# Patient Record
Sex: Female | Born: 2000 | Race: White | Hispanic: No | Marital: Single | Smoking: Never smoker
Health system: Southern US, Community
[De-identification: ages and names within clinical notes are randomized; demographics above are authoritative.]

---
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
## 2020-04-30 ENCOUNTER — Other Ambulatory Visit: Payer: Self-pay

## 2020-04-30 ENCOUNTER — Emergency Department: Payer: Medicaid Other

## 2020-04-30 DIAGNOSIS — R42 Dizziness and giddiness: Secondary | ICD-10-CM | POA: Diagnosis not present

## 2020-04-30 DIAGNOSIS — F410 Panic disorder [episodic paroxysmal anxiety] without agoraphobia: Secondary | ICD-10-CM | POA: Diagnosis not present

## 2020-04-30 DIAGNOSIS — R0602 Shortness of breath: Secondary | ICD-10-CM | POA: Insufficient documentation

## 2020-04-30 DIAGNOSIS — Z79899 Other long term (current) drug therapy: Secondary | ICD-10-CM | POA: Diagnosis not present

## 2020-04-30 DIAGNOSIS — R202 Paresthesia of skin: Secondary | ICD-10-CM | POA: Diagnosis not present

## 2020-04-30 DIAGNOSIS — R0789 Other chest pain: Secondary | ICD-10-CM | POA: Diagnosis not present

## 2020-04-30 DIAGNOSIS — R079 Chest pain, unspecified: Secondary | ICD-10-CM | POA: Diagnosis present

## 2020-04-30 LAB — BASIC METABOLIC PANEL
Anion gap: 9 (ref 5–15)
BUN: 8 mg/dL (ref 6–20)
CO2: 22 mmol/L (ref 22–32)
Calcium: 9.5 mg/dL (ref 8.9–10.3)
Chloride: 106 mmol/L (ref 98–111)
Creatinine, Ser: 0.77 mg/dL (ref 0.44–1.00)
GFR, Estimated: >60 mL/min (ref >60)
Glucose, Bld: 122 mg/dL — ABNORMAL HIGH (ref 70–99)
Potassium: 3.7 mmol/L (ref 3.5–5.1)
Sodium: 137 mmol/L (ref 135–145)

## 2020-04-30 LAB — CBC
HCT: 38 % (ref 36.0–46.0)
Hemoglobin: 13 g/dL (ref 12.0–15.0)
MCH: 30 pg (ref 26.0–34.0)
MCHC: 34.2 g/dL (ref 30.0–36.0)
MCV: 87.6 fL (ref 80.0–100.0)
Platelets: 337 10*3/uL (ref 150–400)
RBC: 4.34 MIL/uL (ref 3.87–5.11)
RDW: 12 % (ref 11.5–15.5)
WBC: 8.5 10*3/uL (ref 4.0–10.5)
nRBC: 0 % (ref 0.0–0.2)

## 2020-04-30 LAB — TROPONIN I (HIGH SENSITIVITY): Troponin I (High Sensitivity): <2 ng/L (ref <18)

## 2020-04-30 NOTE — ED Triage Notes (Signed)
'  past few days I feel like there is a rock in my chest, shallow breathing, going up stairs/working out makes shob, inability to focus on letters with reading, L arm going to sleep, dizziness, bilateral ear pressure and tonsils inflammed. This has happened before and they say its my anxiety but this feels different.

## 2020-05-01 ENCOUNTER — Emergency Department
Admission: EM | Admit: 2020-05-01 | Discharge: 2020-05-01 | Disposition: A | Discharge status: Home / Self Care | Payer: Medicaid Other | Attending: Emergency Medicine | Admitting: Emergency Medicine

## 2020-05-01 DIAGNOSIS — F410 Panic disorder [episodic paroxysmal anxiety] without agoraphobia: Secondary | ICD-10-CM

## 2020-05-01 DIAGNOSIS — R0789 Other chest pain: Secondary | ICD-10-CM

## 2020-05-01 LAB — TROPONIN I (HIGH SENSITIVITY): Troponin I (High Sensitivity): <2 ng/L (ref <18)

## 2020-05-01 NOTE — ED Provider Notes (Signed)
[facility] Emergency Department Provider Note  ____________________________________________   Event Date/Time   First MD Initiated Contact with Patient 05/01/20 0157     (approximate)  I have reviewed the triage vital signs and the nursing notes.   HISTORY  Chief Complaint Chest Pain    HPI Kathleen Cannon is a 20 y.o. female with obesity who presents to the emergency department multiple complaints.  She reports for 2 days she has had left-sided chest pain that feels like "a weight on my chest".  She has had associated shortness of breath, head pressure, tingling in her arms, dizziness, feeling like she cannot focus her eyes.  She is not having any symptoms currently.  She states the symptoms have been intermittent.  She does use tobacco products.  No history of PE, DVT, exogenous estrogen use, recent fractures, surgery, trauma, hospitalization, prolonged travel or other immobilization. No lower extremity swelling or pain. No calf tenderness.  No history of CAD, CVA.  She does have a history of anxiety.        No past medical history on file.  There are no problems to display for this patient.   No past surgical history on file.  Prior to Admission medications   Medication Sig Start Date End Date Taking? Authorizing Provider  cholecalciferol (VITAMIN D3) 25 MCG (1000 UNIT) tablet Take 1,000 Units by mouth daily.   Yes [provider]  sodium chloride (OCEAN) 0.65 % SOLN nasal spray Place 1 spray into both nostrils as needed for congestion.   Yes [provider]  Diphenhyd-Hydrocort-Nystatin (FIRST-DUKES MOUTHWASH) SUSP Use as directed 5 mLs in the mouth or throat 4 (four) times daily -  before meals and at bedtime. 11/09/16   Tommi Rumps, PA-C  hydrOXYzine (ATARAX/VISTARIL) 25 MG tablet Take 1 tablet (25 mg total) by mouth every 6 (six) hours as needed for itching. 11/09/16   Tommi Rumps, PA-C    Allergies Patient has  no known allergies.  No family history on file.  Social History Social History   Tobacco Use  . Smoking status: Never Smoker    Review of Systems Constitutional: No fever. Eyes: No visual changes. ENT: No sore throat. Cardiovascular: Denies chest pain. Respiratory: Denies shortness of breath. Gastrointestinal: No nausea, vomiting, diarrhea. Genitourinary: Negative for dysuria. Musculoskeletal: Negative for back pain. Skin: Negative for rash. Neurological: Negative for focal weakness or numbness.  ____________________________________________   PHYSICAL EXAM:  VITAL SIGNS: ED Triage Vitals  Enc Vitals Group     BP 04/30/20 2145 124/81     Pulse Rate 04/30/20 2145 92     Resp 04/30/20 2145 14     Temp 04/30/20 2145 98.5 F (36.9 C)     Temp Source 04/30/20 2145 Oral     SpO2 04/30/20 2145 98 %     Weight 04/30/20 2144 210 lb (95.3 kg)     Height 04/30/20 2144 5\' 4"  (1.626 m)     Head Circumference --      Peak Flow --      Pain Score 04/30/20 2144 2     Pain Loc --      Pain Edu? --      Excl. in GC? --    CONSTITUTIONAL: Alert and oriented and responds appropriately to questions. Well-appearing; well-nourished HEAD: Normocephalic EYES: Conjunctivae clear, pupils appear equal, EOM appear intact ENT: normal nose; moist mucous membranes NECK: Supple, normal ROM CARD: RRR; S1 and S2 appreciated; no murmurs, no clicks,  no rubs, no gallops RESP: Normal chest excursion without splinting or tachypnea; breath sounds clear and equal bilaterally; no wheezes, no rhonchi, no rales, no hypoxia or respiratory distress, speaking full sentences ABD/GI: Normal bowel sounds; non-distended; soft, non-tender, no rebound, no guarding, no peritoneal signs, no hepatosplenomegaly BACK: The back appears normal EXT: Normal ROM in all joints; no deformity noted, no edema; no cyanosis, no calf tenderness or calf swelling SKIN: Normal color for age and race; warm; no rash on exposed  skin NEURO: Moves all extremities equally, normal sensation diffusely, strength 5/5 all 4 extremities, cranial nerves II through XII intact, normal speech PSYCH: The patient's mood and manner are appropriate.  Reports anxiety.  Denies SI or HI.  ____________________________________________   LABS (all labs ordered are listed, but only abnormal results are displayed)  Labs Reviewed  BASIC METABOLIC PANEL - Abnormal; Notable for the following components:      Result Value   Glucose, Bld 122 (*)    All other components within normal limits  CBC  POC URINE PREG, ED  TROPONIN I (HIGH SENSITIVITY)  TROPONIN I (HIGH SENSITIVITY)   ____________________________________________  EKG   Date: 05/01/2020 21:39  Rate: 85  Rhythm: normal sinus rhythm with sinus arrhythmia  QRS Axis: normal  Intervals: normal  ST/T Wave abnormalities: normal  Conduction Disutrbances: none  Narrative Interpretation: unremarkable     ____________________________________________  RADIOLOGY I, Asmi Fugere, personally viewed and evaluated these images (plain radiographs) as part of my medical decision making, as well as reviewing the written report by the radiologist.  ED MD interpretation: Chest x-ray clear  Official radiology report(s): DG Chest 2 View  Result Date: 04/30/2020 CLINICAL DATA:  Dyspnea EXAM: CHEST - 2 VIEW COMPARISON:  None. FINDINGS: The heart size and mediastinal contours are within normal limits. Both lungs are clear. The visualized skeletal structures are unremarkable. IMPRESSION: No active cardiopulmonary disease. Electronically Signed   By: Helyn Numbers MD   On: 04/30/2020 22:08    ____________________________________________   PROCEDURES  Procedure(s) performed (including Critical Care):  Procedures    ____________________________________________   INITIAL IMPRESSION / ASSESSMENT AND PLAN / ED COURSE  As part of my medical decision making, I reviewed the following  data within the electronic MEDICAL RECORD NUMBER Nursing notes reviewed and incorporated, Labs reviewed , EKG interpreted , Old chart reviewed, Radiograph reviewed  and Notes from prior ED visits         Patient here with very atypical chest pain.  Suspect symptoms are secondary to panic attacks.  She is asymptomatic currently.  Cardiac work-up is unremarkable with 2 normal troponins.  She is PERC negative.  Doubt PE.  Doubt dissection.  Chest x-ray clear showing no pneumonia, pneumothorax or CHF.  She has a normal neurologic exam.  Doubt stroke.  She denies having a headache currently or chest pain currently.  Have advised her to use over-the-counter Tylenol, Motrin as needed for pain.  Given outpatient PCP and psychiatry follow-up information.  Patient is comfortable with this plan.  At this time, I do not feel there is any life-threatening condition present. I have reviewed, interpreted and discussed all results (EKG, imaging, lab, urine as appropriate) and exam findings with patient/family. I have reviewed nursing notes and appropriate previous records.  I feel the patient is safe to be discharged home without further emergent workup and can continue workup as an outpatient as needed. Discussed usual and customary return precautions. Patient/family verbalize understanding and are comfortable with this plan.  Outpatient follow-up has been provided as needed. All questions have been answered.  ____________________________________________   FINAL CLINICAL IMPRESSION(S) / ED DIAGNOSES  Final diagnoses:  Atypical chest pain  Panic attack     ED Discharge Orders    None      *Please note:  Kathleen Cannon was evaluated in Emergency Department on 05/01/2020 for the symptoms described in the history of present illness. She was evaluated in the context of the global COVID-19 pandemic, which necessitated consideration that the patient might be at risk for infection with the SARS-CoV-2 virus that  causes COVID-19. Institutional protocols and algorithms that pertain to the evaluation of patients at risk for COVID-19 are in a state of rapid change based on information released by regulatory bodies including the CDC and federal and state organizations. These policies and algorithms were followed during the patient's care in the ED.  Some ED evaluations and interventions may be delayed as a result of limited staffing during and the pandemic.*   Note:  This document was prepared using Dragon voice recognition software and may include unintentional dictation errors.   Tory Septer, Layla Maw, DO 05/01/20 [phone]

## 2020-05-01 NOTE — Discharge Instructions (Addendum)
Steps to find a Primary Care Provider (PCP): ° °Call [phone] or [phone] to access "[facility] Find a Doctor Service." ° °2.  You may also go on the [facility] website at [email or web address] ° ° °You may alternate Tylenol 1000 mg every 6 hours as needed for pain, fever and Ibuprofen 800 mg every 8 hours as needed for pain, fever.  Please take Ibuprofen with food.  Do not take more than 4000 mg of Tylenol (acetaminophen) in a 24 hour period. ° °

## 2020-05-01 NOTE — ED Notes (Signed)
Pt updated on wait. Pt verbalizes understanding.  

## 2021-12-19 IMAGING — CR DG CHEST 2V
1 series · 2 of 2 positions shown · non-contrast
Comparison: None.

CLINICAL DATA: Dyspnea

EXAM:
CHEST - 2 VIEW

[Series 1: dg chest 2 view · 0.14mm/px · 2 of 2 slices shown]
[im 1/2]
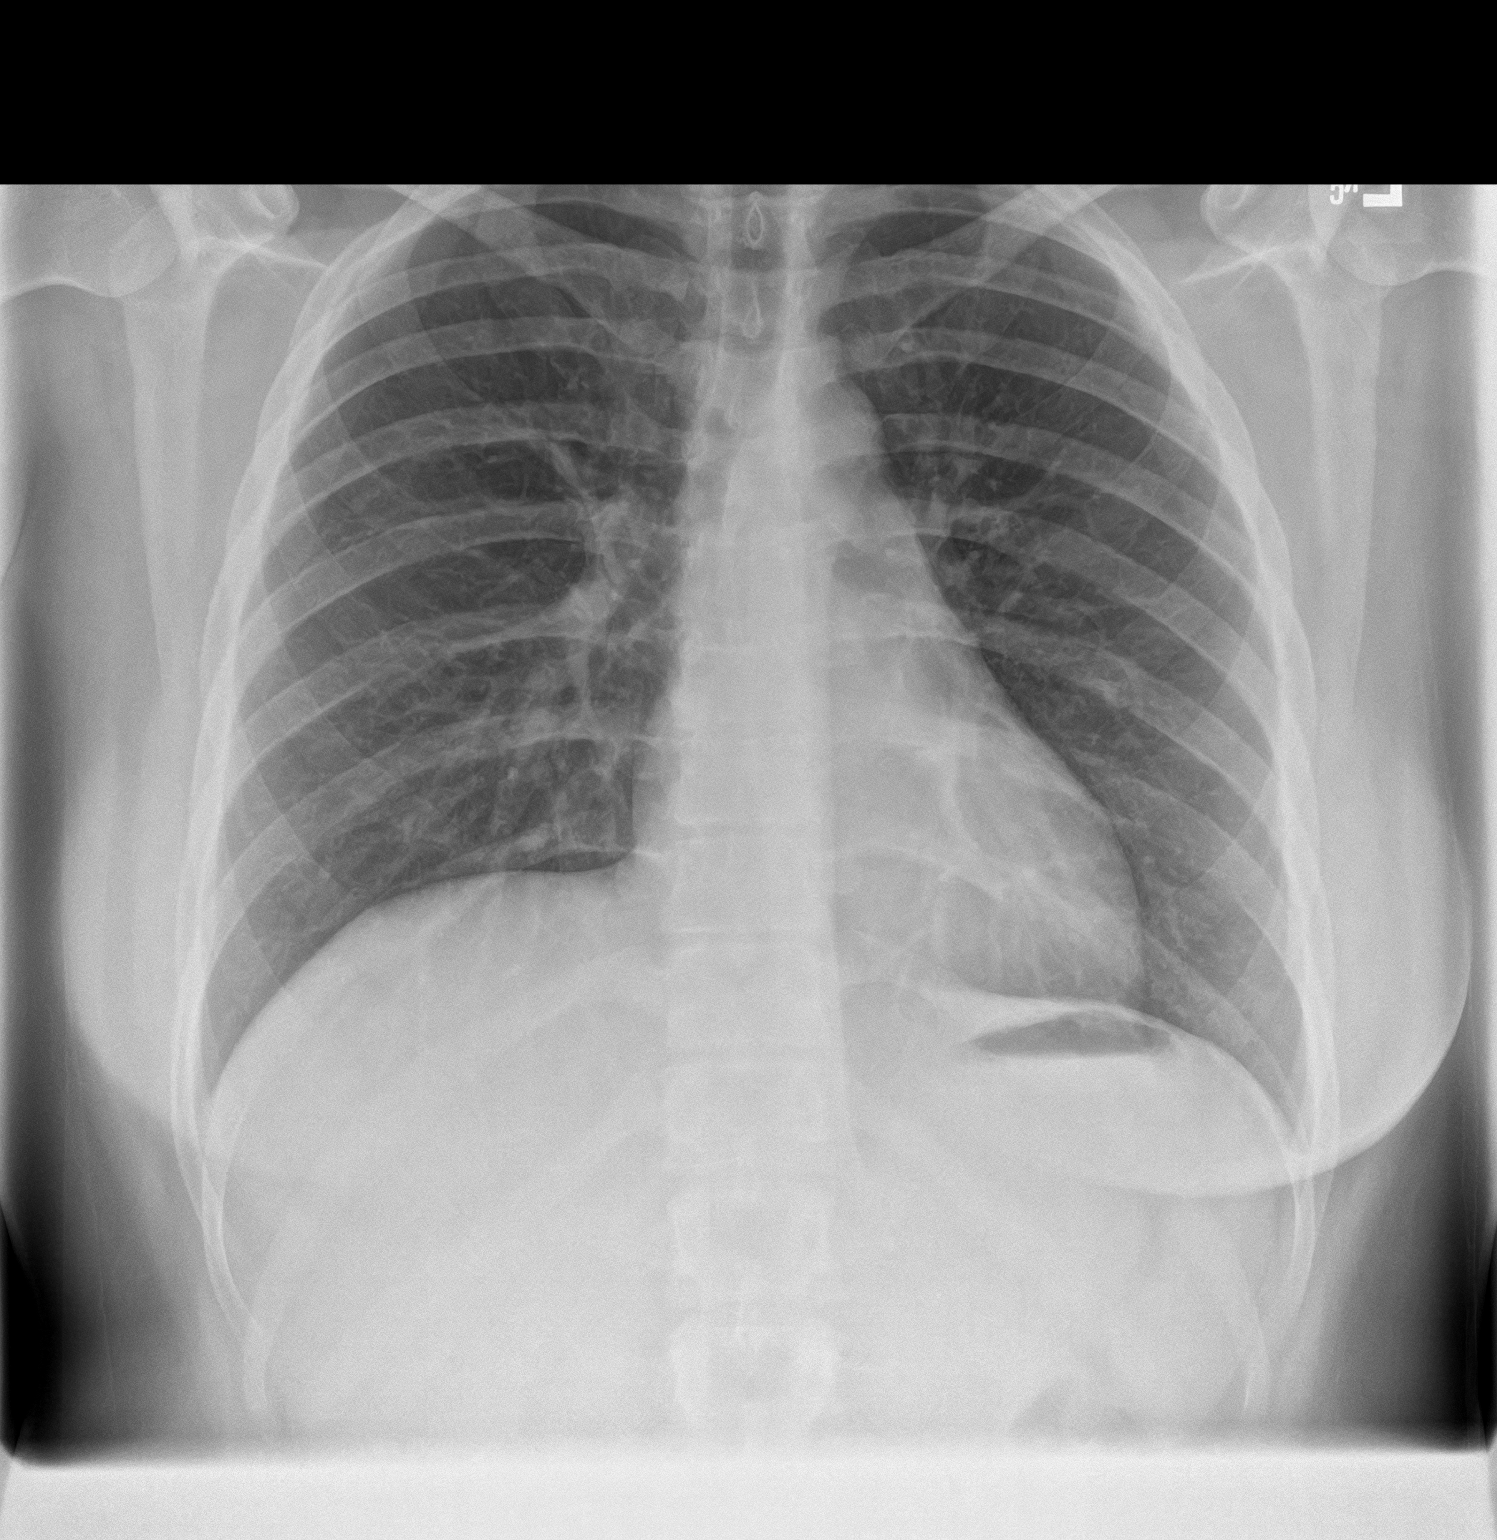
[im 2/2]
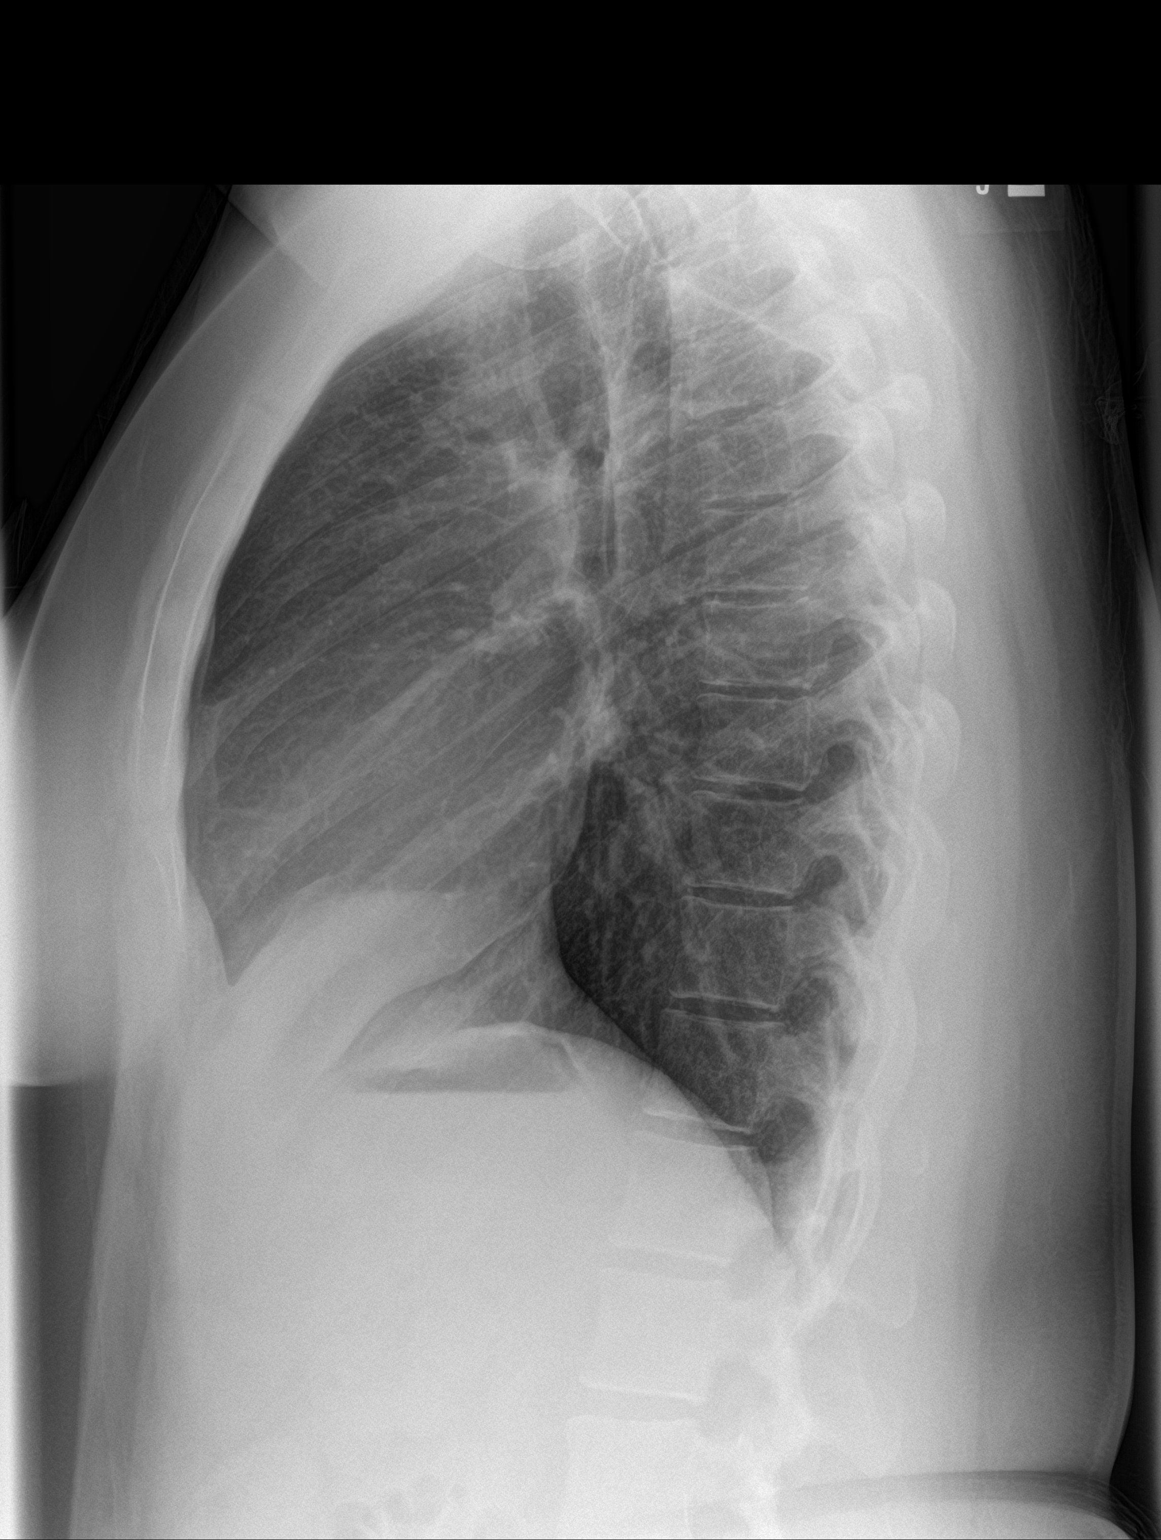

[2 of 2 positions shown; findings below may reference images not displayed]

FINDINGS: The heart size and mediastinal contours are within normal limits.
Both lungs are clear. The visualized skeletal structures are
unremarkable.
IMPRESSION: No active cardiopulmonary disease.
